# Patient Record
Sex: Female | Born: 1945 | Race: White | Hispanic: No | Marital: Married
Health system: Southern US, Community
[De-identification: ages and names within clinical notes are randomized; demographics above are authoritative.]

---

## 1998-05-19 ENCOUNTER — Ambulatory Visit (HOSPITAL_COMMUNITY): Admission: RE | Admit: 1998-05-19 | Discharge: 1998-05-19 | Payer: Self-pay | Admitting: Neurological Surgery

## 1999-05-16 ENCOUNTER — Ambulatory Visit (HOSPITAL_COMMUNITY): Admission: RE | Admit: 1999-05-16 | Discharge: 1999-05-16 | Payer: Self-pay | Admitting: Neurological Surgery

## 1999-05-16 ENCOUNTER — Encounter: Payer: Self-pay | Admitting: Neurological Surgery

## 1999-08-18 ENCOUNTER — Encounter: Admission: RE | Admit: 1999-08-18 | Discharge: 1999-08-18 | Payer: Self-pay | Admitting: Family Medicine

## 1999-08-18 ENCOUNTER — Encounter: Payer: Self-pay | Admitting: Family Medicine

## 1999-11-06 ENCOUNTER — Ambulatory Visit (HOSPITAL_COMMUNITY): Admission: RE | Admit: 1999-11-06 | Discharge: 1999-11-06 | Payer: Self-pay | Admitting: Gastroenterology

## 1999-11-06 ENCOUNTER — Encounter (INDEPENDENT_AMBULATORY_CARE_PROVIDER_SITE_OTHER): Payer: Self-pay | Admitting: *Deleted

## 2000-10-10 ENCOUNTER — Encounter: Payer: Self-pay | Admitting: Family Medicine

## 2000-10-10 ENCOUNTER — Encounter: Admission: RE | Admit: 2000-10-10 | Discharge: 2000-10-10 | Payer: Self-pay | Admitting: Family Medicine

## 2000-11-13 ENCOUNTER — Encounter: Payer: Self-pay | Admitting: Neurological Surgery

## 2000-11-13 ENCOUNTER — Ambulatory Visit (HOSPITAL_COMMUNITY): Admission: RE | Admit: 2000-11-13 | Discharge: 2000-11-13 | Payer: Self-pay | Admitting: Neurological Surgery

## 2003-01-20 ENCOUNTER — Encounter (INDEPENDENT_AMBULATORY_CARE_PROVIDER_SITE_OTHER): Payer: Self-pay | Admitting: Specialist

## 2003-01-20 ENCOUNTER — Ambulatory Visit (HOSPITAL_COMMUNITY): Admission: RE | Admit: 2003-01-20 | Discharge: 2003-01-20 | Payer: Self-pay | Admitting: Gastroenterology

## 2003-04-13 ENCOUNTER — Encounter: Admission: RE | Admit: 2003-04-13 | Discharge: 2003-04-13 | Payer: Self-pay | Admitting: Family Medicine

## 2003-04-13 ENCOUNTER — Encounter: Payer: Self-pay | Admitting: Family Medicine

## 2004-09-23 ENCOUNTER — Ambulatory Visit (HOSPITAL_COMMUNITY): Admission: RE | Admit: 2004-09-23 | Discharge: 2004-09-23 | Payer: Self-pay | Admitting: Neurosurgery

## 2005-05-29 ENCOUNTER — Encounter: Admission: RE | Admit: 2005-05-29 | Discharge: 2005-05-29 | Payer: Self-pay | Admitting: Family Medicine

## 2005-12-25 ENCOUNTER — Encounter: Admission: RE | Admit: 2005-12-25 | Discharge: 2005-12-25 | Payer: Self-pay | Admitting: Family Medicine

## 2006-01-25 ENCOUNTER — Ambulatory Visit (HOSPITAL_COMMUNITY): Admission: RE | Admit: 2006-01-25 | Discharge: 2006-01-25 | Payer: Self-pay | Admitting: Neurological Surgery

## 2006-04-02 ENCOUNTER — Ambulatory Visit (HOSPITAL_COMMUNITY): Admission: RE | Admit: 2006-04-02 | Discharge: 2006-04-02 | Payer: Self-pay | Admitting: Neurological Surgery

## 2006-04-04 ENCOUNTER — Emergency Department (HOSPITAL_COMMUNITY): Admission: EM | Admit: 2006-04-04 | Discharge: 2006-04-05 | Payer: Self-pay | Admitting: Emergency Medicine

## 2006-05-15 ENCOUNTER — Ambulatory Visit (HOSPITAL_COMMUNITY): Admission: RE | Admit: 2006-05-15 | Discharge: 2006-05-15 | Payer: Self-pay | Admitting: Neurological Surgery

## 2007-02-11 ENCOUNTER — Encounter: Admission: RE | Admit: 2007-02-11 | Discharge: 2007-02-11 | Payer: Self-pay | Admitting: Family Medicine

## 2007-09-02 ENCOUNTER — Ambulatory Visit (HOSPITAL_COMMUNITY): Admission: RE | Admit: 2007-09-02 | Discharge: 2007-09-02 | Payer: Self-pay | Admitting: Surgery

## 2007-09-02 ENCOUNTER — Encounter (INDEPENDENT_AMBULATORY_CARE_PROVIDER_SITE_OTHER): Payer: Self-pay | Admitting: Surgery

## 2007-09-19 ENCOUNTER — Ambulatory Visit (HOSPITAL_COMMUNITY): Admission: RE | Admit: 2007-09-19 | Discharge: 2007-09-19 | Payer: Self-pay | Admitting: Surgery

## 2007-10-06 ENCOUNTER — Encounter: Admission: RE | Admit: 2007-10-06 | Discharge: 2007-10-06 | Payer: Self-pay | Admitting: Surgery

## 2007-10-13 ENCOUNTER — Encounter: Admission: RE | Admit: 2007-10-13 | Discharge: 2007-10-13 | Payer: Self-pay | Admitting: Internal Medicine

## 2009-08-12 ENCOUNTER — Encounter: Admission: RE | Admit: 2009-08-12 | Discharge: 2009-08-12 | Payer: Self-pay | Admitting: Family Medicine

## 2010-09-25 ENCOUNTER — Encounter
Admission: RE | Admit: 2010-09-25 | Discharge: 2010-09-25 | Payer: Self-pay | Source: Home / Self Care | Attending: Family Medicine | Admitting: Family Medicine

## 2010-10-01 ENCOUNTER — Encounter: Payer: Self-pay | Admitting: Neurological Surgery

## 2011-01-23 NOTE — Op Note (Signed)
NAMEVERENA, Atkins                ACCOUNT NO.:  [id number]   MEDICAL RECORD NO.:  [id number]          PATIENT TYPE:  AMB   LOCATION:  DAY                          FACILITY:  [facility]   PHYSICIAN:  Thornton Park. Daphine Deutscher, MD  DATE OF BIRTH:  April 12, 1946   DATE OF PROCEDURE:  09/02/2007  DATE OF DISCHARGE:                               OPERATIVE REPORT   CCS NUMBER:  [id number].   CHIEF COMPLAINT:  Right upper quadrant pain, with again gallstones and  sludge.   POSTOPERATIVE DIAGNOSIS:  Chronic cholecystitis with cholelithiasis.   SURGEON:  Thornton Park. Daphine Deutscher, MD.   ASSISTANTMarland Kitchen  Sandria Bales. Ezzard Standing, M.D.   ANESTHESIA:  General endotracheal.   DESCRIPTION OF PROCEDURE:  Ms. [name] is a 65 year old white  female who was taken to OR #1 at [facility] on September 01, 1989 and  given general anesthesia.  The abdomen was prepped with Techni-Care and  draped sterilely.  I entered the abdomen through the left upper quadrant  using a 5 mm OptiVu; insufflating edges as I came and got into the  abdomen, and creating space and then coming on into the abdomen with the  5 mm trocar using a 0-degree scope.  This was the Ethicon OptiVu.  I  then placed a 5 in the right side, and used that to come in and take  down some adhesions from a previous midline incision; whereupon I put a  5 mm in the midline, a 10 in the upper midline, and 2 more 5s laterally  on the right.  Through those we were able to delineated Calot's  triangle.  I then put a clip on the gallbladder and opened the cystic  duct and milked back a lot of sludge and little teeny, tiny black  anthracotic pigment stones.  This was more like sludge.  I then shot a  dynamic cholangiogram, showing a very circuitous cystic duct which was  fairly long; and common bile duct flowed into the duodenum with  intrahepatic filling which showed no intrahepatic or common duct filling  defects.  Cystic duct was then triple clipped and divided.  Cystic  artery was  double clipped and divided.  The gallbladder was removed from  the gallbladder bed without emptying it.  It was then placed in a bag  and brought out through the 10 mm upper abdominal port.  I surveyed the  abdomen.  No other problems were noted.  Port sites were injected with  Marcaine.  The patient was taken to the recovery room, after closing the  skin with 4-0 Vicryl, Benzoin and Steri-Strips.   FINAL DIAGNOSIS:  Chronic cholecystitis, as evidenced by adhesions from  the infundibulum to the duodenum -- which were chronic-appearing in  nature.     Thornton Park Daphine Deutscher, MD  Electronically Signed    MBM/MEDQ  D:  09/02/2007  T:  09/03/2007  Job:  [id number]   cc:   Dr. Lois Huxley

## 2011-01-26 NOTE — Op Note (Signed)
   Kristin Atkins, Kristin Atkins                          ACCOUNT NO.:  [id number]   MEDICAL RECORD NO.:  [id number]                   PATIENT TYPE:  AMB   LOCATION:  ENDO                                 FACILITY:  [facility]   PHYSICIAN:  Bernette Redbird, M.D.                DATE OF BIRTH:  1945-12-14   DATE OF PROCEDURE:  01/20/2003  DATE OF DISCHARGE:                                 OPERATIVE REPORT   PROCEDURE:  Upper endoscopy with biopsies.   INDICATION:  Weight loss and nonspecific abdominal symptoms with frequent  loose stools in a 65 year old female.   FINDINGS:  Normal exam.   DESCRIPTION OF PROCEDURE:  The nature, purpose, and risks of the procedure  were familiar to the patient from prior examination, and she provided  written consent.  Sedation was fentanyl 100 mcg and Versed 9 mg IV prior to  this procedure and the colonoscopy which followed it.  The Olympus video  endoscope was passed under direct vision.  The vocal cords looked normal.  There was a very small hiatal hernia present, but the esophagus itself was  normal without evidence of free reflux, reflux esophagitis, Barrett's  esophagus, varices infection, neoplasia, or any ring or stricture.   The stomach contained no significant residual and had normal mucosa without  evidence of gastritis, erosions, ulcers, polyps, or masses, and the pylorus,  duodenal bulb, and second duodenum looked normal including the villous  pattern of the duodenal mucosa.  Nevertheless, several duodenal biopsies  were obtained prior to removal of the scope in view of the patient's weight  loss and diarrhea.   The patient tolerated the procedure well, and there were no apparent  complications.  Note, that a retroflexed view of the proximal stomach showed  a small hiatal hernia from its inferior perspective and a minimally patulous  diaphragmatic hiatus but no other abnormalities.   The patient tolerated the procedure well, and there were no  apparent  complications.   IMPRESSION:  Essentially normal endoscopy.   PLAN:  1. Await pathology on duodenal biopsies.  2. Proceed to colonoscopic evaluation.                                               Bernette Redbird, M.D.   RB/MEDQ  D:  01/20/2003  T:  01/20/2003  Job:  [id number]   cc:   [facility]  [address] [place] [address]

## 2011-01-26 NOTE — Op Note (Signed)
[place]. [facility]  Patient:    SHEVELLE, Kristin Atkins                       MRN: [id number] Proc. Date: 11/06/99 Adm. Date:  [id number] Attending:  Rich Brave CC:         Lanier Prude, G.N.P., [facility]             [place], [place], [address]                           Operative Report  PROCEDURE:  Colonoscopy with polypectomy and biopsies.  INDICATIONS:  A 65 year old female with family history of colon cancer and colon polyps, recent 35 pound weight loss, and intermittent hemoccult positive stool.  FINDINGS:  Multiple small to medium size polyps.  Minimal diverticulosis.  DESCRIPTION OF PROCEDURE:  The nature, purpose, and risks of the procedure had een discussed with the patient who provided written consent.  This procedure was performed immediately following her upper endoscopy with total sedation for the two procedures being fentanyl 80 mcg and Versed 10 mg IV without arrhythmias or desaturation.  The Olympus pediatric video colonoscope was advanced without too  much difficulty around the colon to the base of the cecum and was identified by  clear visualization of the appendiceal orifice.  Some external abdominal compression with the patient in the supine position was administered to facilitate advancement.  At the base of the cecum was a 4 mm sessile polyp which I removed by approximately six cold biopsies.  In the ascending colon, I encountered three polyps, each about 5 or 6 mm across, one of which was quite sessile, another of which was semipedunculated, each removed by snare technique with good hemostasis and no evidence of excessive cautery. ne of these polyps was so flat and sessile, that I elected to inject the base of it with 1:10,000 epinephrine (0.5 cc) to create a pseudostalk, making snaring of the lesion easier.  These polyps in the ascending colon were all retrieved by suctioning through the scope, although,  two of them had to be sliced into smaller pieces to be able to be suctioned through.  There was a rare right-sided diverticulum, but no diffuse diverticulosis.  In the rectum, I encountered two small sessile polyps each removed by a single old biopsy.  Retroflexion in the rectum prior to this maneuver was unremarkable.  No large polyps, cancer, colitis, or vascular malformations were observed during this examination.  The patient tolerated it well and there were no apparent complications.  IMPRESSION: 1. Multiple polyps, primarily in the proximal colon, removed as described    above. 2. Minimal diverticulosis with solitary small diverticulum being seen in the    ascending colon.  PLAN:  Await pathology on the polyps. DD:  11/06/99 TD:  11/06/99 Job: [id number]

## 2011-01-26 NOTE — Op Note (Signed)
[name], [name]                ACCOUNT NO.:  [id number]   MEDICAL RECORD NO.:  [id number]          PATIENT TYPE:  AMB   LOCATION:  SDS                          FACILITY:  [facility]   PHYSICIAN:  Stefani Dama, M.D.  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   POSTOPERATIVE DIAGNOSIS:  Intractable right lower extremity pain secondary  to lumbosacral plexus neurofibroma, status post partial resection.   PROCEDURE:  Temporary implantation of spinal cord stimulation electrode.   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  She underwent partial resection and has had foot drop since  that time and has had continued problems with pain.  The patient has been  treated medically but is now being admitted to undergo trial of spinal cord  stimulation.   PROCEDURE:  The patient was brought to the operating room and placed prone  on the table.  The back was prepped with DuraPrep and then draped sterilely.  A 17-gauge Tuohy needle was then inserted into the spine into the interspace  at the T12-L1 level.  A catheter was then threaded in the epidural space  using fluoroscopic guidance to locate the catheter between the T8 and T10  vertebrae.  Radiographic confirmation was obtained in the AP and lateral  projections.  The catheter position was noted to be good and trial  stimulation while in the operating room revealed good coverage into the  right lower extremity.  With this, no further adjustments were made to the  catheter and the 17-gauge needle was removed and, subsequently, the catheter  was sewn into position with a single 3-0 nylon stitch.   Dry sterile dressing  was applied and the patient was returned to the recovery room for further  trials of stimulation.      Stefani Dama, M.D.  Electronically Signed     HJE/MEDQ  D:  04/02/2006  T:  04/02/2006  Job:  [id number]

## 2011-01-26 NOTE — Procedures (Signed)
[place]. [facility]  Patient:    Kristin Atkins, Kristin Atkins                       MRN: [id number] Proc. Date: 11/06/99 Adm. Date:  [id number] Attending:  Rich Brave Dictator:   Florencia Reasons, M.D. CC:         Lanier Prude, G.N.P. at [facility] in [facility],                           Procedure Report  PROCEDURE:  Upper endoscopy with biopsies.  INDICATIONS:  The patient is a 65 year old with unintentional 35 pound weight loss and history of Hemoccult positive stool.  FINDINGS:  Small hiatal hernia.  DESCRIPTION OF PROCEDURE:  The nature, purpose, and risks of the procedure had een discussed with the patient who provided written consent.  Sedation for this procedure and the colonoscopy which followed it total phentanyl 80 mcg and Versed 10 mg intravenously without arrhythmias or desaturations.  The Olympus small caliber adult video endoscope was passed easily under direct vision.  The larynx looked normal, as did the vocal cords.  The esophagus was quite easily entered and had normal mucosa without evidence of Barretts esophagus, reflux esophagitis, varices, infection, or neoplasia.  No ring or stricture was  encountered, but there was a 3 to 4 cm hiatal hernia.  The stomach was entered.  It contained perhaps a small bilious residual, but was otherwise normal without evidence of gastritis, erosions, ulcers, polyps, or masses, including a retroflex view of the proximal stomach which showed the diaphragmatic hiatus to be somewhat patchulous.  The pylorus, duodenal bulb, and second duodenum looked normal too.  The pylorus appeared slightly patchulous, and the lower esophageal sphincter muscle appeared somewhat lax as far as inspection with the scope goes.  Duodenal biopsies were obtained to help exclude celiac disease as a possible cause of the patients recent weight loss, and the scope was then removed from the patient who  tolerated the procedure well without apparent complication.  IMPRESSION:  Medium sized hiatal hernia, otherwise normal upper endoscopy.  PLAN: 1. Await pathology on duodenal biopsies. 2. Proceed to colonoscopic evaluation. DD:  11/06/99 TD:  11/06/99 Job: [id number]

## 2011-01-26 NOTE — Op Note (Signed)
   NAMECYDNIE, Kristin Atkins                          ACCOUNT NO.:  [id number]   MEDICAL RECORD NO.:  [id number]                   PATIENT TYPE:  AMB   LOCATION:  ENDO                                 FACILITY:  [facility]   PHYSICIAN:  Bernette Redbird, M.D.                DATE OF BIRTH:  April 22, 1946   DATE OF PROCEDURE:  01/20/2003  DATE OF DISCHARGE:                                 OPERATIVE REPORT   PROCEDURE:  Colonoscopy with biopsies.   INDICATION:  A 65 year old female with prior history of colonic adenoma  removed several years ago and also with irregular bowel habits.   FINDINGS:  Normal exam to the terminal ileum.   DESCRIPTION OF PROCEDURE:  The nature, purpose, and risks of the procedure  were familiar to the patient from prior examination, and she provided  written consent.  Sedation was fentanyl 100 mcg and Versed 9 mg IV for the  endoscopy which preceded this exam and for this exam itself.  The Olympus  adjustable-tension pediatric video colonoscope was advanced without too much  difficulty to the terminal ileum, using some external abdominal compression  to control looping.  Pullback was then performed.  The TI had a normal  appearance, although I did obtain a couple of random biopsies of it.   The quality of the prep in the colon was excellent, and it is felt that all  areas were well seen.   This was a normal examination other than perhaps a few minimal diverticula.  There was no overt diverticulosis.  No polyps, cancer, colitis, or vascular  malformations were noted.  The rectum had a little bit of blotchy erythema,  but this was thought to probably be an artifact of the prep since the  underlying vascular mucosal pattern was otherwise normal.   Random biopsies were obtained along the length of the colon to rule out  microscopic or collagenous colitis.   No polyps, masses, or vascular malformations were observed.   Retroflexion in the rectum was unremarkable.   The  patient tolerated the procedure well, and there were no apparent  complications.   IMPRESSION:  Normal colonoscopy in a patient with a prior history of a  colonic adenoma having been removed and with recent altered bowel habits.    PLAN:  1. Await pathology on random biopsies.  2. Office follow-up as scheduled a couple of months from now.                                               Bernette Redbird, M.D.    RB/MEDQ  D:  01/20/2003  T:  01/20/2003  Job:  [id number]   cc:   [facility]  [address], [place]  [address]

## 2011-06-15 LAB — BASIC METABOLIC PANEL
Calcium: 9.3
Chloride: 110
GFR calc Af Amer: >60
Glucose, Bld: 94

## 2011-06-15 LAB — HEMOGLOBIN AND HEMATOCRIT, BLOOD: Hemoglobin: 13.4

## 2014-12-22 DIAGNOSIS — F419 Anxiety disorder, unspecified: Secondary | ICD-10-CM | POA: Insufficient documentation

## 2018-07-08 ENCOUNTER — Other Ambulatory Visit: Payer: Self-pay | Admitting: Orthopaedic Surgery

## 2018-07-08 DIAGNOSIS — M25511 Pain in right shoulder: Secondary | ICD-10-CM

## 2018-07-14 ENCOUNTER — Ambulatory Visit
Admission: RE | Admit: 2018-07-14 | Discharge: 2018-07-14 | Disposition: A | Discharge status: Home / Self Care | Payer: Medicare Other | Source: Ambulatory Visit | Attending: Orthopaedic Surgery | Admitting: Orthopaedic Surgery

## 2018-07-14 DIAGNOSIS — M25511 Pain in right shoulder: Secondary | ICD-10-CM

## 2019-01-15 ENCOUNTER — Ambulatory Visit (HOSPITAL_COMMUNITY): Payer: Medicare Other | Admitting: Licensed Clinical Social Worker

## 2019-01-15 DIAGNOSIS — F321 Major depressive disorder, single episode, moderate: Secondary | ICD-10-CM

## 2019-01-15 NOTE — Progress Notes (Unsigned)
Comprehensive Clinical Assessment (CCA) Note  01/15/2019 Kristin Atkins [id number]   Virtual Visit via Telephone Note  I connected with Kristin Atkins on 02/23/19 at 10:00 AM EDT by telephone and verified that I am speaking with the correct person using two identifiers.   I discussed the limitations, risks, security and privacy concerns of performing an evaluation and management service by telephone and the availability of in person appointments. I also discussed with the patient that there may be a patient responsible charge related to this service. The patient expressed understanding and agreed to proceed.  I discussed the assessment and treatment plan with the patient. The patient was provided an opportunity to ask questions and all were answered. The patient agreed with the plan and demonstrated an understanding of the instructions.   The patient was advised to call back or seek an in-person evaluation if the symptoms worsen or if the condition fails to improve as anticipated.  I provided 58 minutes of non-face-to-face time during this encounter.   Lillie Fragmin, LCSW    Visit Diagnosis:      ICD-10-CM   1. Major depressive disorder, single episode, moderate (HCC) F32.1       CCA Part One  Part One has been completed on paper by the patient.  (See scanned document in Chart Review)  CCA Part Two A  Intake/Chief Complaint:  depression  Mental Health Symptoms Depression:  low energy, isolating, depressed mood, anhedonia, indecisive,  no appetite, difficulty concentrating, no patience/irritability, loneliness, apathy  Mania:  n/a  Anxiety:  n/a  Psychosis:  n/a  Trauma:  n/a  Obsessions:  n/a  Compulsions:  n/a  Inattention:  some difficulty making decisions  Hyperactivity/Impulsivity:  n/a  Oppositional/Defiant Behaviors:  n/a  Borderline Personality:  n/a  Other Mood/Personality Symptoms:  n/a   Mental Status Exam  TELEPHONIC SESSION:  Thought and Language   Speech flow:  Within normal limits  Thought content:   Within normal Limits  Preoccupation:   Some rumination  Hallucinations:   None  Organization:   Slightly tangential  [employer] of Knowledge:   Appropriate  Intelligence:   Average  Abstraction:   Approriate  Judgement:   Fair  Art therapist:   Intact  Insight:   Fair  Decision Making: Slight Impairment     Stress  Stressors:  losses/lack of identity  Coping Ability:  fair  Skill Deficits:  none  Supports:  daughter, sister, friend   Family and Psychosocial History:  Patient was married for 25 years, her husband died 2 years ago after he had battled Alzheimer's dementia and multiple strokes. She was his main caregiver until he went into a nursing home and died there, though she reports that there was little caretaking to do.  Patient has a daughter who lives with her since his death. Has 4 sisters and 3 brothers, 2 of whom are deceased. One sister is also a widow, feels like they are the "black sheep" of the family because they are alone. This has created an improved bond with that sister, to whom she talks daily. Sister is 79 years older, but they got to church together.  Before losing job and husband was on a Engineer, civil (consulting), went out with couples friends. Feels like now she is just half of herself and does not know what to do.  "I don't know how to mourn, I don't know how to live on my own without him or without my work."  Childhood History:   No history of depression in the past. Feels like needs were met growing up, had a good childhood, big family. No trauma history.  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 Work was her social outlet and intellectual stimulation, and she has really missed being able to. 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 Still thinks about whether she did something wrong to deserve being let go, though she cannot figure out what it could have been.   Education: Graduated high school  Religion: Darrick Meigs, active in the church and Sunday school class  Leisure/Recreation: Used to be very social, enjoys people and being around people.   Exercise/Diet: Not much appetite these days, has lost about 40 pounds in the past couple of years. Has a sweet-tooth, eats doughnuts every day.   CCA Part Two C  Alcohol/Drug Use: Does not use alcohol or other substances  CCA Part Three  ASAM's:  Six Dimensions of Multidimensional Assessment  Dimension 1:  Acute Intoxication and/or Withdrawal Potential:   none, only uses nicotine  Dimension 2:  Biomedical Conditions and Complications:   tumor on the sciatic nerve  Dimension 3:  Emotional, Behavioral, or Cognitive Conditions and Complications:   depression, feels very alone   Dimension 4:  Readiness to Change:   wants to feel better and be more active but cannot motivate herself to do so   Dimension 5:  Relapse, Continued use, or Continued Problem Potential:   not motivated for much change   Dimension 6:  Recovery/Living Environment:   positive living environment, has a house her daughter lives in with er   Substance use Disorder (SUD) Only smokes cigarettes, would like to stop but cannot seem to (has tried several times)  Social Function:   much less social than she used to be, has a sister she talks to daily, one friend she keeps in contact with, feels different from others now that she is a widow  Stress:  "I just don't know anything anymore," feels overwhelmed and not herself   Risk Assessment- Self-Harm Potential: No thoughts of suicide (did have suicidal thoughts many years  ago when she found out she would need to wear leg brace for the rest of her life),Currently the only thoughts of death are that it should have been her rather than her husband or passing thoughts   Risk Assessment -Dangerous to Others Potential: No thoughts about harming others, no threat of danger  DSM5 Diagnoses: F32.1 Major Depressive Disorder, Single Episode, Moderate  Patient Centered Plan: Patient is on the following Treatment Plan(s) Depression  Recommendations for Services/Supports/Treatments: Patient is eager for counseling, referral to someone near [place], [place]  Treatment Plan Summary: Elevate mood and show evidence of usual energy, activities, and socialization level  Lillie Fragmin

## 2019-01-28 ENCOUNTER — Telehealth: Payer: Self-pay | Admitting: Licensed Clinical Social Worker
